# Patient Record
Sex: Male | Born: 1990 | Race: Black or African American | Hispanic: No | Marital: Single | Smoking: Never smoker
Health system: Southern US, Community
[De-identification: ages and names within clinical notes are randomized; demographics above are authoritative.]

---

## 2010-04-20 ENCOUNTER — Emergency Department (HOSPITAL_COMMUNITY): Admission: EM | Admit: 2010-04-20 | Discharge: 2010-04-20 | Payer: Self-pay | Admitting: Emergency Medicine

## 2010-07-05 ENCOUNTER — Emergency Department (HOSPITAL_COMMUNITY): Admission: EM | Admit: 2010-07-05 | Discharge: 2010-07-05 | Payer: Self-pay | Admitting: Emergency Medicine

## 2011-01-21 LAB — DIFFERENTIAL
Basophils Relative: 1 % (ref 0–1)
Eosinophils Absolute: 0.2 10*3/uL (ref 0.0–0.7)
Eosinophils Relative: 4 % (ref 0–5)
Monocytes Absolute: 0.6 10*3/uL (ref 0.1–1.0)
Neutro Abs: 2.9 10*3/uL (ref 1.7–7.7)
Neutrophils Relative %: 48 % (ref 43–77)

## 2011-01-21 LAB — CBC
Hemoglobin: 13.9 g/dL (ref 13.0–17.0)
MCHC: 32.6 g/dL (ref 30.0–36.0)
RBC: 4.3 MIL/uL (ref 4.22–5.81)
WBC: 6.1 10*3/uL (ref 4.0–10.5)

## 2011-01-21 LAB — URINALYSIS, ROUTINE W REFLEX MICROSCOPIC
Bilirubin Urine: NEGATIVE
Ketones, ur: NEGATIVE mg/dL
Protein, ur: NEGATIVE mg/dL
pH: 6 (ref 5.0–8.0)

## 2011-01-21 LAB — BASIC METABOLIC PANEL
BUN: 12 mg/dL (ref 6–23)
Chloride: 103 mEq/L (ref 96–112)
Creatinine, Ser: 0.95 mg/dL (ref 0.4–1.5)
GFR calc non Af Amer: >60 mL/min (ref >60)
Glucose, Bld: 101 mg/dL — ABNORMAL HIGH (ref 70–99)
Potassium: 3.5 mEq/L (ref 3.5–5.1)

## 2011-02-01 ENCOUNTER — Emergency Department (HOSPITAL_COMMUNITY)
Admission: EM | Admit: 2011-02-01 | Discharge: 2011-02-01 | Disposition: A | Discharge status: Home / Self Care | Payer: Medicaid Other | Attending: Emergency Medicine | Admitting: Emergency Medicine

## 2011-02-01 DIAGNOSIS — R059 Cough, unspecified: Secondary | ICD-10-CM | POA: Insufficient documentation

## 2011-02-01 DIAGNOSIS — K5289 Other specified noninfective gastroenteritis and colitis: Secondary | ICD-10-CM | POA: Insufficient documentation

## 2011-02-01 DIAGNOSIS — R05 Cough: Secondary | ICD-10-CM | POA: Insufficient documentation

## 2011-02-01 DIAGNOSIS — R109 Unspecified abdominal pain: Secondary | ICD-10-CM | POA: Insufficient documentation

## 2011-02-01 LAB — BASIC METABOLIC PANEL
BUN: 13 mg/dL (ref 6–23)
Calcium: 9.6 mg/dL (ref 8.4–10.5)
Chloride: 100 mEq/L (ref 96–112)
Glucose, Bld: 94 mg/dL (ref 70–99)

## 2011-02-01 LAB — DIFFERENTIAL
Basophils Absolute: 0 10*3/uL (ref 0.0–0.1)
Basophils Relative: 0 % (ref 0–1)
Eosinophils Absolute: 0.3 10*3/uL (ref 0.0–0.7)
Eosinophils Relative: 3 % (ref 0–5)
Lymphocytes Relative: 31 % (ref 12–46)
Lymphs Abs: 3 10*3/uL (ref 0.7–4.0)
Neutro Abs: 5.5 10*3/uL (ref 1.7–7.7)

## 2011-02-01 LAB — CBC
HCT: 44.8 % (ref 39.0–52.0)
MCHC: 34.6 g/dL (ref 30.0–36.0)
RBC: 4.71 MIL/uL (ref 4.22–5.81)
RDW: 11.8 % (ref 11.5–15.5)

## 2012-04-15 IMAGING — CR DG SCAPULA*L*
2 series · 2 of 2 positions shown · non-contrast
Comparison: None.

CLINICAL DATA: Fall.  Pain.

LEFT SCAPULA - 2+ VIEWS
TECHNIQUE: Two views were obtained

[view not recorded (1 of 2)]
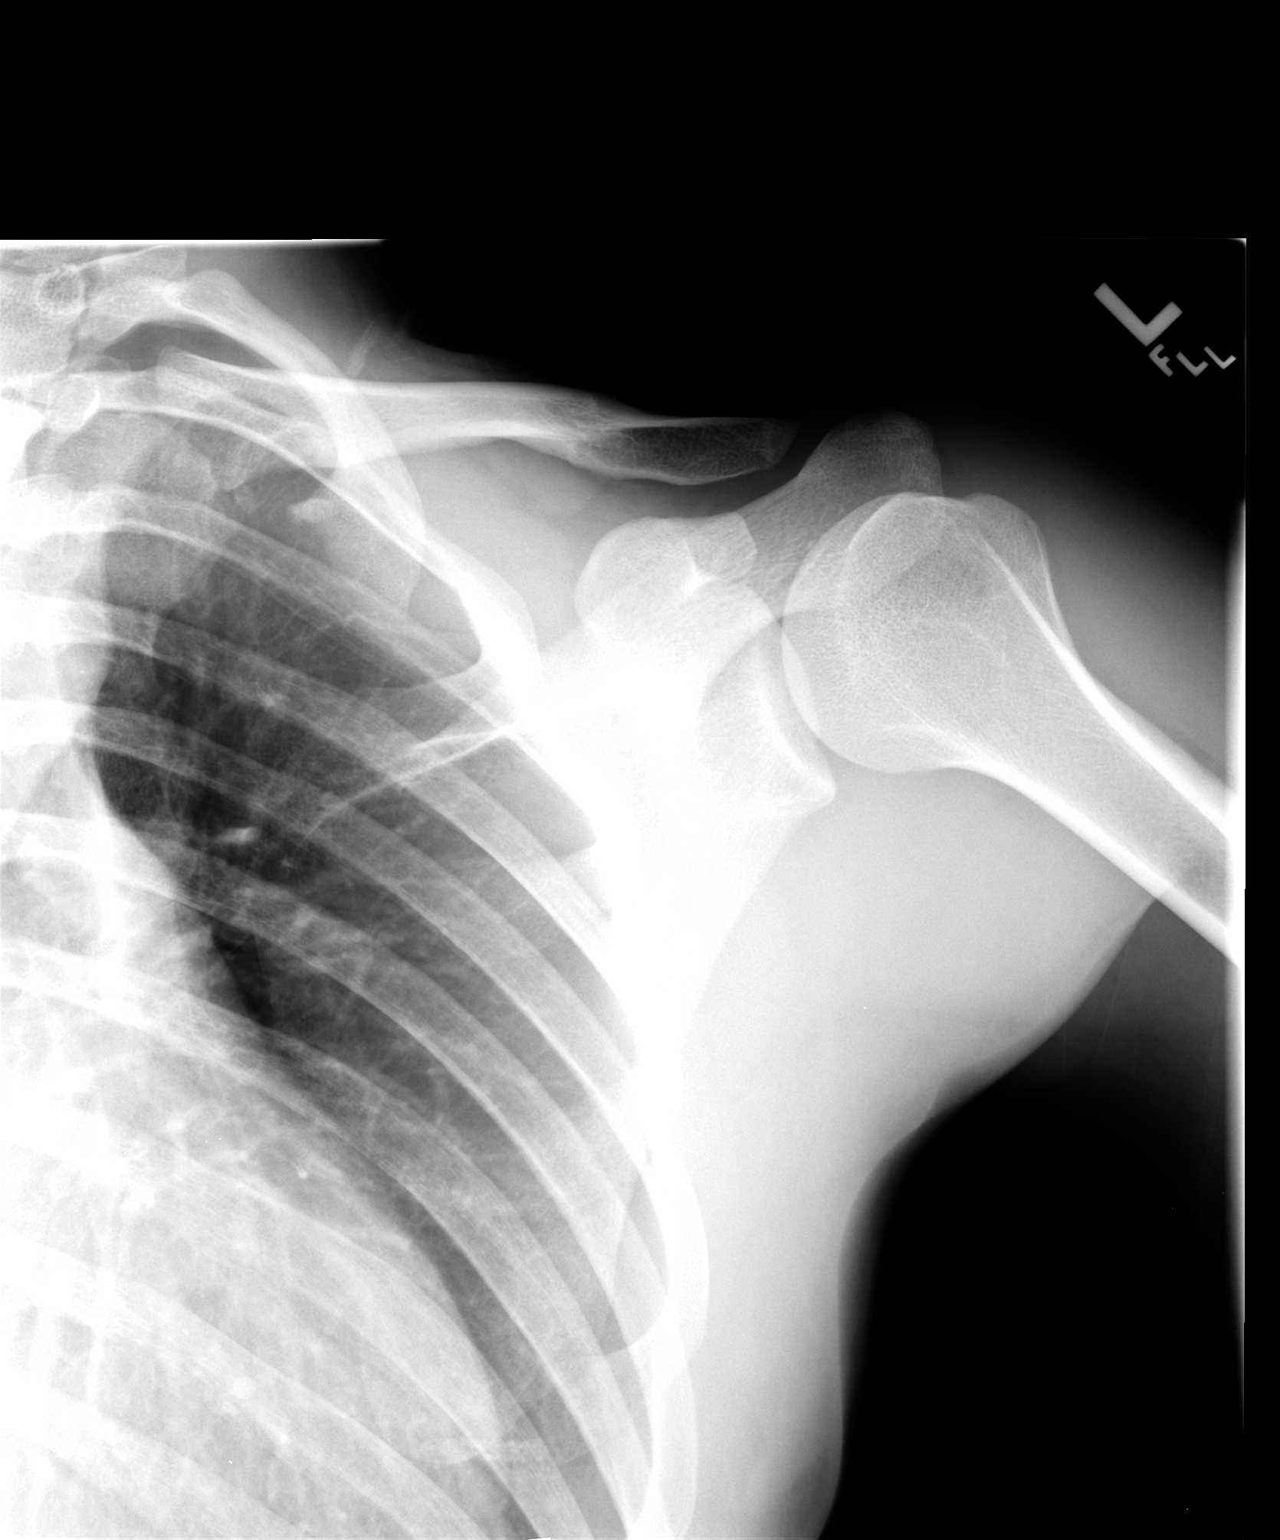

[view not recorded (2 of 2)]
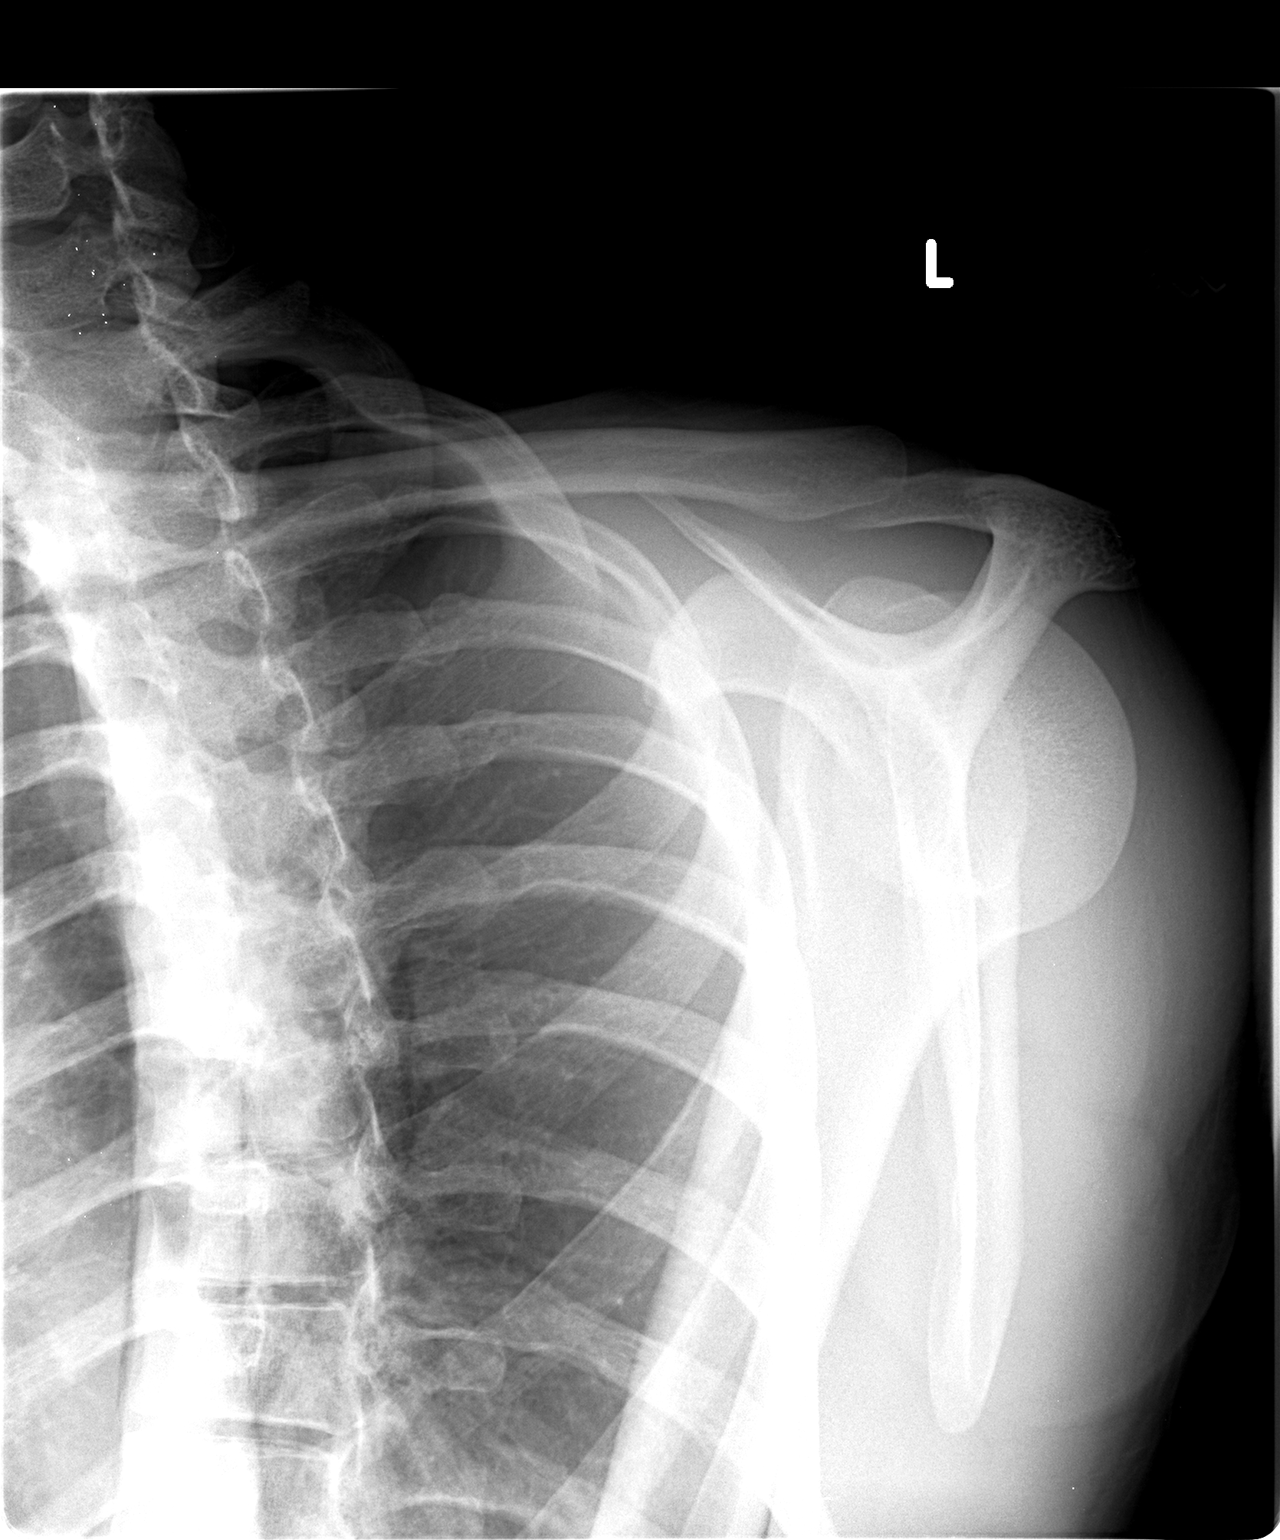

[2 of 2 positions shown; findings below may reference images not displayed]

FINDINGS: Normal alignment.  Negative for fracture.  The
glenohumeral joint and AC joint are normal.  No regional rib
fractures.
IMPRESSION: Normal

## 2014-02-27 ENCOUNTER — Emergency Department (HOSPITAL_COMMUNITY)
Admission: EM | Admit: 2014-02-27 | Discharge: 2014-02-27 | Disposition: A | Discharge status: Home / Self Care | Payer: PRIVATE HEALTH INSURANCE | Attending: Emergency Medicine | Admitting: Emergency Medicine

## 2014-02-27 ENCOUNTER — Encounter (HOSPITAL_COMMUNITY): Payer: Self-pay | Admitting: Emergency Medicine

## 2014-02-27 DIAGNOSIS — Z79899 Other long term (current) drug therapy: Secondary | ICD-10-CM | POA: Insufficient documentation

## 2014-02-27 DIAGNOSIS — IMO0002 Reserved for concepts with insufficient information to code with codable children: Secondary | ICD-10-CM | POA: Insufficient documentation

## 2014-02-27 DIAGNOSIS — L509 Urticaria, unspecified: Secondary | ICD-10-CM | POA: Insufficient documentation

## 2014-02-27 MED ORDER — DIPHENHYDRAMINE HCL 25 MG PO CAPS
50.0000 mg | ORAL_CAPSULE | Freq: Once | ORAL | Status: AC
  Administered 2014-02-27: 50 mg via ORAL
  Filled 2014-02-27: qty 2

## 2014-02-27 MED ORDER — FAMOTIDINE 20 MG PO TABS
20.0000 mg | ORAL_TABLET | Freq: Once | ORAL | Status: AC
  Administered 2014-02-27: 20 mg via ORAL
  Filled 2014-02-27: qty 1

## 2014-02-27 MED ORDER — RANITIDINE HCL 150 MG PO TABS: 150.0000 mg | ORAL_TABLET | Freq: Two times a day (BID) | ORAL | Status: DC

## 2014-02-27 MED ORDER — PREDNISONE 20 MG PO TABS
60.0000 mg | ORAL_TABLET | Freq: Once | ORAL | Status: AC
  Administered 2014-02-27: 60 mg via ORAL
  Filled 2014-02-27: qty 3

## 2014-02-27 MED ORDER — DIPHENHYDRAMINE HCL 25 MG PO CAPS: 25.0000 mg | ORAL_CAPSULE | Freq: Four times a day (QID) | ORAL | Status: DC | PRN

## 2014-02-27 MED ORDER — PREDNISONE 50 MG PO TABS: 50.0000 mg | ORAL_TABLET | Freq: Every day | ORAL | Status: DC

## 2014-02-27 NOTE — ED Notes (Signed)
Discharge and follow up reviewed with pt. Pt verbalized understanding.  

## 2014-02-27 NOTE — ED Notes (Signed)
Patient reports itching, swelling and aches all over body for 3 days, did start nevea lotion a few weeks ago and reports hes been fishing the past few days. Unknown exposure

## 2014-02-27 NOTE — ED Provider Notes (Signed)
CSN: [id number]     Arrival date & time 02/27/14  [id number] History   First MD Initiated Contact with Patient 02/27/14 0457     Chief Complaint  Patient presents with  . Allergic Reaction     (Consider location/radiation/quality/duration/timing/severity/associated sxs/prior Treatment) HPI Comments: Pt comes in with cc of itching, and rash. Symptoms started 3 days ago. Pt is using new lotion and soap, but has no hx of same. Overtime, his rash has spread, located in his hands, forearms, torso, thighs. PT has no known allergy hx, denies any wheezing.  Patient is a 23 y.o. male presenting with allergic reaction. The history is provided by the patient.  Allergic Reaction Presenting symptoms: rash   Presenting symptoms: no wheezing     History reviewed. No pertinent past medical history. History reviewed. No pertinent past surgical history. History reviewed. No pertinent family history. History  Substance Use Topics  . Smoking status: Never Smoker   . Smokeless tobacco: Not on file  . Alcohol Use: Yes    Review of Systems  HENT: Negative for rhinorrhea, sinus pressure and sneezing.   Respiratory: Negative for cough and wheezing.   Skin: Positive for rash.  Allergic/Immunologic: Negative for environmental allergies, food allergies and immunocompromised state.      Allergies  Other  Home Medications   Prior to Admission medications   Medication Sig Start Date End Date Taking? Authorizing Provider  loratadine (CLARITIN) 10 MG tablet Take 10 mg by mouth once.   Yes Historical Provider, MD  diphenhydrAMINE (BENADRYL) 25 mg capsule Take 1 capsule (25 mg total) by mouth every 6 (six) hours as needed for itching. 02/27/14   Derwood KaplanAnkit Israel Wunder, MD  predniSONE (DELTASONE) 50 MG tablet Take 1 tablet (50 mg total) by mouth daily. 02/27/14   Derwood KaplanAnkit Seeley [place], MD  ranitidine (ZANTAC) 150 MG tablet Take 1 tablet (150 mg total) by mouth 2 (two) times daily. 02/27/14   Carlissa Pesola, MD   BP 153/66   Pulse 79  Temp(Src) 98.1 F (36.7 C) (Oral)  Resp 18  SpO2 100% Physical Exam  Nursing note and vitals reviewed. Constitutional: He appears well-developed.  HENT:  Head: Normocephalic.  Eyes: Conjunctivae are normal.  Neck: Neck supple.  Cardiovascular: Normal rate and regular rhythm.   Pulmonary/Chest: Effort normal.  Abdominal: Soft.  Neurological: He is alert.  Skin: Rash noted.  Urticarial wheals diffusely, with blanching    ED Course  Procedures (including critical care time) Labs Review Labs Reviewed - No data to display  Imaging Review No results found.   EKG Interpretation None      MDM   Final diagnoses:  Urticaria    Pt with what appears to be an urticarial rash. Bo airway issues. Pt is using new soap and lotion. Will give meds for symptoms, and see an allergist if needed.   Derwood KaplanAnkit Tekelia Kareem, MD 02/27/14 [phone]

## 2014-02-27 NOTE — Discharge Instructions (Signed)

## 2014-02-27 NOTE — ED Notes (Signed)
Pt states he thinks he may be having an allergic reaction, but he is unsure to what. Pt states for the last three days he has had bilateral hand aching, and swelling. Pt states today he noticed some redness, and itching. Pt states he did not eat anything different. Pt states he has been fishing for the past three days and may have been bitten by a bug. Pt rates pain in hands 9/10.

## 2014-06-04 ENCOUNTER — Emergency Department (HOSPITAL_COMMUNITY)
Admission: EM | Admit: 2014-06-04 | Discharge: 2014-06-04 | Discharge status: Against Medical Advice | Payer: PRIVATE HEALTH INSURANCE | Attending: Emergency Medicine | Admitting: Emergency Medicine

## 2014-06-04 ENCOUNTER — Encounter (HOSPITAL_COMMUNITY): Payer: Self-pay | Admitting: Emergency Medicine

## 2014-06-04 DIAGNOSIS — R109 Unspecified abdominal pain: Secondary | ICD-10-CM | POA: Insufficient documentation

## 2014-06-04 LAB — CBC WITH DIFFERENTIAL/PLATELET
Basophils Absolute: 0 10*3/uL (ref 0.0–0.1)
Basophils Relative: 1 % (ref 0–1)
EOS ABS: 0.5 10*3/uL (ref 0.0–0.7)
Eosinophils Relative: 7 % — ABNORMAL HIGH (ref 0–5)
HEMATOCRIT: 45.5 % (ref 39.0–52.0)
HEMOGLOBIN: 15.4 g/dL (ref 13.0–17.0)
LYMPHS ABS: 2.2 10*3/uL (ref 0.7–4.0)
LYMPHS PCT: 34 % (ref 12–46)
MCH: 33 pg (ref 26.0–34.0)
MCHC: 33.8 g/dL (ref 30.0–36.0)
MCV: 97.6 fL (ref 78.0–100.0)
MONOS PCT: 6 % (ref 3–12)
Monocytes Absolute: 0.4 10*3/uL (ref 0.1–1.0)
NEUTROS PCT: 52 % (ref 43–77)
Neutro Abs: 3.4 10*3/uL (ref 1.7–7.7)
PLATELETS: 204 10*3/uL (ref 150–400)
RBC: 4.66 MIL/uL (ref 4.22–5.81)
RDW: 12 % (ref 11.5–15.5)
WBC: 6.5 10*3/uL (ref 4.0–10.5)

## 2014-06-04 LAB — COMPREHENSIVE METABOLIC PANEL
ALT: 13 U/L (ref 0–53)
AST: 14 U/L (ref 0–37)
Albumin: 3.9 g/dL (ref 3.5–5.2)
Alkaline Phosphatase: 38 U/L — ABNORMAL LOW (ref 39–117)
Anion gap: 11 (ref 5–15)
BILIRUBIN TOTAL: 0.7 mg/dL (ref 0.3–1.2)
BUN: 9 mg/dL (ref 6–23)
CO2: 30 meq/L (ref 19–32)
CREATININE: 0.98 mg/dL (ref 0.50–1.35)
Calcium: 9.1 mg/dL (ref 8.4–10.5)
Chloride: 103 mEq/L (ref 96–112)
GFR calc non Af Amer: >90 mL/min (ref >90)
Glucose, Bld: 99 mg/dL (ref 70–99)
Potassium: 4.2 mEq/L (ref 3.7–5.3)
SODIUM: 144 meq/L (ref 137–147)
TOTAL PROTEIN: 6.5 g/dL (ref 6.0–8.3)

## 2014-06-04 LAB — LIPASE, BLOOD: LIPASE: 14 U/L (ref 11–59)

## 2014-06-04 NOTE — ED Notes (Signed)
Pt c/o intermittent lower abd pain x 7 years. 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 Pt reports he vomits every morning. Reports he also gets nauseous often and the only way he can eat anything is if he smokes weed. Denies diarrhea. Denies difficulty urinating. Pt sts he is here because he is tired of dealing with the pain//n/v and he can't get a job because he has to smoke everyday. Nad, skin warm and dry, resp e/u.

## 2014-06-04 NOTE — ED Notes (Signed)
Called for triage x3

## 2017-01-12 ENCOUNTER — Ambulatory Visit: Payer: Self-pay | Admitting: Allergy & Immunology
